# Patient Record
Sex: Male | Born: 1995
Health system: Southern US, Community
[De-identification: ages and names within clinical notes are randomized; demographics above are authoritative.]

---
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
## 2019-02-12 ENCOUNTER — Other Ambulatory Visit: Payer: Self-pay

## 2019-02-12 ENCOUNTER — Encounter: Payer: Self-pay | Admitting: Family Medicine

## 2019-02-12 ENCOUNTER — Ambulatory Visit: Payer: BLUE CROSS/BLUE SHIELD | Admitting: Family Medicine

## 2019-02-12 DIAGNOSIS — M24559 Contracture, unspecified hip: Secondary | ICD-10-CM | POA: Diagnosis not present

## 2019-02-12 DIAGNOSIS — M999 Biomechanical lesion, unspecified: Secondary | ICD-10-CM | POA: Diagnosis not present

## 2019-02-12 NOTE — Patient Instructions (Signed)
Good to see you  Ice is your friend Exercises 3 times a week.  You should do well  Se eme again in 6 weeks [employer] for fathers day ;)

## 2019-02-12 NOTE — Progress Notes (Signed)
Tawana ScaleZach  D[facility] [address] [place], [place] [address] Phone: [phone] Subjective:   Bruce Donath, Valerie Wolf, am serving as a scribe for Dr. Antoine PrimasZachary .   CC: Pain in low back  OZH:[id number]:Subjective  Nathan Duffelustin M Skowron is a 23 y.o. male coming in with complaint of pain throughout his spine. Feels that his back gets tight easily. Has not been cheering but has been running a lot recently. Initially started out having a lot of pain but is not having as much pain. Can sometimes have tingling down his glute and into the left leg.        History reviewed. No pertinent past medical history. History reviewed. No pertinent surgical history. Social History   Socioeconomic History  . Marital status: Single    Spouse name: Not on file  . Number of children: Not on file  . Years of education: Not on file  . Highest education level: Not on file  Occupational History  . Not on file  Social Needs  . Financial resource strain: Not on file  . Food insecurity:    Worry: Not on file    Inability: Not on file  . Transportation needs:    Medical: Not on file    Non-medical: Not on file  Tobacco Use  . Smoking status: Not on file  Substance and Sexual Activity  . Alcohol use: Not on file  . Drug use: Not on file  . Sexual activity: Not on file  Lifestyle  . Physical activity:    Days per week: Not on file    Minutes per session: Not on file  . Stress: Not on file  Relationships  . Social connections:    Talks on phone: Not on file    Gets together: Not on file    Attends religious service: Not on file    Active member of club or organization: Not on file    Attends meetings of clubs or organizations: Not on file    Relationship status: Not on file  Other Topics Concern  . Not on file  Social History Narrative  . Not on file   Not on File History reviewed. No pertinent family history.  No family history of autoimmune No current outpatient medications on file.    Past medical history, social, surgical and family history all reviewed in electronic medical record.  No pertanent information unless stated regarding to the chief complaint.   Review of Systems:  No headache, visual changes, nausea, vomiting, diarrhea, constipation, dizziness, abdominal pain, skin rash, fevers, chills, night sweats, weight loss, swollen lymph nodes, body aches, joint swelling, , chest pain, shortness of breath, mood changes.  Positive muscle aches  Objective  Blood pressure 118/78, pulse (!) 59, height 1' (0.305 m), weight 230 lb (104.3 kg), SpO2 99 %. Systems examined below as of    General: No apparent distress alert and oriented x3 mood and affect normal, dressed appropriately.  HEENT: Pupils equal, extraocular movements intact  Respiratory: Patient's speak in full sentences and does not appear short of breath  Cardiovascular: No lower extremity edema, non tender, no erythema  Skin: Warm dry intact with no signs of infection or rash on extremities or on axial skeleton.  Abdomen: Soft nontender  Neuro: Cranial nerves II through XII are intact, neurovascularly intact in all extremities with 2+ DTRs and 2+ pulses.  Lymph: No lymphadenopathy of posterior or anterior cervical chain or axillae bilaterally.  Gait normal with good balance and  coordination.  MSK:  Non tender with full range of motion and good stability and symmetric strength and tone of shoulders, elbows, wrist, hip, knee and ankles bilaterally.  Back Exam:  Inspection: Unremarkable  Motion: Flexion 35 deg, Extension 25 deg, Side Bending to 35 deg bilaterally,  Rotation to 45 deg bilaterally  SLR laying: Negative  XSLR laying: Negative  Palpable tenderness: Tender to palpation the paraspinal musculature lumbar spine right greater than left. FABER: Tightness bilaterally right greater than left. Sensory change: Gross sensation intact to all lumbar and sacral dermatomes.  Reflexes: 2+ at both patellar tendons,  2+ at achilles tendons, Babinski's downgoing.  Strength at foot  Plantar-flexion: 5/5 Dorsi-flexion: 5/5 Eversion: 5/5 Inversion: 5/5  Leg strength  Quad: 5/5 Hamstring: 5/5 Hip flexor: 5/5 Hip abductors: 5/5    Osteopathic findings  C2 flexed rotated and side bent right C4 flexed rotated and side bent left C6 flexed rotated and side bent left T3 extended rotated and side bent right inhaled third rib T9 extended rotated and side bent left L2 flexed rotated and side bent right Sacrum right on right    Impression and Recommendations:     This case required medical decision making of moderate complexity. The above documentation has been reviewed and is accurate and complete Judi Saa, DO       Note: This dictation was prepared with Dragon dictation along with smaller phrase technology. Any transcriptional errors that result from this process are unintentional.

## 2019-02-12 NOTE — Assessment & Plan Note (Signed)
Decision today to treat with OMT was based on Physical Exam  After verbal consent patient was treated with HVLA, ME, FPR techniques in  thoracic, lumbar and sacral areas  Patient tolerated the procedure well with improvement in symptoms  Patient given exercises, stretches and lifestyle modifications  See medications in patient instructions if given  Patient will follow up in 4-6 weeks 

## 2019-02-12 NOTE — Assessment & Plan Note (Signed)
Hip flexor..  Bilateral.  Discussed icing regimen exercises.  Discussed core strengthening and hip stability.  Home exercises given.  Responded well to manipulation.  Follow-up again in 4 to 6 weeks

## 2019-03-26 ENCOUNTER — Ambulatory Visit: Payer: BLUE CROSS/BLUE SHIELD | Admitting: Family Medicine

## 2019-03-26 ENCOUNTER — Encounter: Payer: Self-pay | Admitting: Family Medicine

## 2019-03-26 ENCOUNTER — Other Ambulatory Visit: Payer: Self-pay

## 2019-03-26 VITALS — BP 110/80 | Wt 239.0 lb

## 2019-03-26 DIAGNOSIS — M999 Biomechanical lesion, unspecified: Secondary | ICD-10-CM | POA: Diagnosis not present

## 2019-03-26 DIAGNOSIS — M24559 Contracture, unspecified hip: Secondary | ICD-10-CM

## 2019-03-26 NOTE — Assessment & Plan Note (Signed)
Decision today to treat with OMT was based on Physical Exam  After verbal consent patient was treated with HVLA, ME, FPR techniques in  thoracic, lumbar and sacral areas  Patient tolerated the procedure well with improvement in symptoms  Patient given exercises, stretches and lifestyle modifications  See medications in patient instructions if given  Patient will follow up in 4-8 weeks 

## 2019-03-26 NOTE — Assessment & Plan Note (Signed)
Tightness still noted.  We discussed proper lifting mechanics especially with squatting.  Discussed home exercises, discussed which activities to do which wants to avoid.  Patient increase activity slowly.  Follow-up again in 4 to 8 weeks

## 2019-03-26 NOTE — Progress Notes (Signed)
Nathan ScaleZach Manav Bradley D.O[facility] [address] [place], [place] [address] Phone: [phone] Subjective:   Nathan Bradley, Nathan Bradley, am serving as a scribe for Dr. Antoine PrimasZachary Jordynn Bradley.   CC:  Back pain follow up   [name]  Nathan Bradley is a 23 y.o. male coming in with complaint of back pain. Just started to run and feels that his back is tight. Has been doing well otherwise.       No past medical history on file. No past surgical history on file. Social History   Socioeconomic History  . Marital status: Single    Spouse name: Not on file  . Number of children: Not on file  . Years of education: Not on file  . Highest education level: Not on file  Occupational History  . Not on file  Social Needs  . Financial resource strain: Not on file  . Food insecurity    Worry: Not on file    Inability: Not on file  . Transportation needs    Medical: Not on file    Non-medical: Not on file  Tobacco Use  . Smoking status: Not on file  Substance and Sexual Activity  . Alcohol use: Not on file  . Drug use: Not on file  . Sexual activity: Not on file  Lifestyle  . Physical activity    Days per week: Not on file    Minutes per session: Not on file  . Stress: Not on file  Relationships  . Social Musicianconnections    Talks on phone: Not on file    Gets together: Not on file    Attends religious service: Not on file    Active member of club or organization: Not on file    Attends meetings of clubs or organizations: Not on file    Relationship status: Not on file  Other Topics Concern  . Not on file  Social History Narrative  . Not on file   Not on File No family history on file.  No family history of autoimmune No current outpatient medications on file.    Past medical history, social, surgical and family history all reviewed in electronic medical record.  No pertanent information unless stated regarding to the chief complaint.   Review of Systems:  No headache, visual  changes, nausea, vomiting, diarrhea, constipation, dizziness, abdominal pain, skin rash, fevers, chills, night sweats, weight loss, swollen lymph nodes, body aches, joint swelling,  chest pain, shortness of breath, mood changes.  Mild positive muscle aches  Objective  Blood pressure 110/80, weight 239 lb (108.4 kg).   General: No apparent distress alert and oriented x3 mood and affect normal, dressed appropriately.  HEENT: Pupils equal, extraocular movements intact  Respiratory: Patient's speak in full sentences and does not appear short of breath  Cardiovascular: No lower extremity edema, non tender, no erythema  Skin: Warm dry intact with no signs of infection or rash on extremities or on axial skeleton.  Abdomen: Soft nontender  Neuro: Cranial nerves II through XII are intact, neurovascularly intact in all extremities with 2+ DTRs and 2+ pulses.  Lymph: No lymphadenopathy of posterior or anterior cervical chain or axillae bilaterally.  Gait normal with good balance and coordination.  MSK:  Non tender with full range of motion and good stability and symmetric strength and tone of shoulders, elbows, wrist, hip, knee and ankles bilaterally.  Back Exam:  Inspection: Loss of lordosis Motion: Flexion 40 deg, Extension 25 deg, Side Bending to  35 deg bilaterally, Rotation to 30 deg bilaterally  SLR laying: Negative  XSLR laying: Negative  Palpable tenderness: Tender to palpation in the paraspinal musculature.Marland Kitchen FABER: positive faber. Sensory change: Gross sensation intact to all lumbar and sacral dermatomes.  Reflexes: 2+ at both patellar tendons, 2+ at achilles tendons, Babinski's downgoing.  Strength at foot  Plantar-flexion: 5/5 Dorsi-flexion: 5/5 Eversion: 5/5 Inversion: 5/5  Leg strength  Quad: 5/5 Hamstring: 5/5 Hip flexor: 5/5 Hip abductors: 5/5  Gait unremarkable.   Osteopathic findings C2 flexed rotated and side bent right C7 flexed rotated and side bent left T3 extended rotated  and side bent right inhaled third rib T7 extended rotated and side bent left L2 flexed rotated and side bent right Sacrum right on right    Impression and Recommendations:     This case required medical decision making of moderate complexity. The above documentation has been reviewed and is accurate and complete Nathan Pulley, DO       Note: This dictation was prepared with Dragon dictation along with smaller phrase technology. Any transcriptional errors that result from this process are unintentional.

## 2019-04-21 DIAGNOSIS — M79675 Pain in left toe(s): Secondary | ICD-10-CM | POA: Diagnosis not present

## 2019-04-21 DIAGNOSIS — L03032 Cellulitis of left toe: Secondary | ICD-10-CM | POA: Diagnosis not present

## 2019-04-21 DIAGNOSIS — L600 Ingrowing nail: Secondary | ICD-10-CM | POA: Diagnosis not present

## 2019-05-01 ENCOUNTER — Ambulatory Visit: Payer: BC Managed Care – PPO | Admitting: Family Medicine

## 2019-05-05 DIAGNOSIS — L600 Ingrowing nail: Secondary | ICD-10-CM | POA: Diagnosis not present

## 2019-05-05 DIAGNOSIS — L03032 Cellulitis of left toe: Secondary | ICD-10-CM | POA: Diagnosis not present

## 2020-10-21 DIAGNOSIS — L03031 Cellulitis of right toe: Secondary | ICD-10-CM | POA: Diagnosis not present

## 2020-10-21 DIAGNOSIS — L600 Ingrowing nail: Secondary | ICD-10-CM | POA: Diagnosis not present

## 2021-04-15 DIAGNOSIS — N62 Hypertrophy of breast: Secondary | ICD-10-CM | POA: Diagnosis not present

## 2021-04-15 DIAGNOSIS — L03031 Cellulitis of right toe: Secondary | ICD-10-CM | POA: Diagnosis not present

## 2021-04-15 DIAGNOSIS — L600 Ingrowing nail: Secondary | ICD-10-CM | POA: Diagnosis not present

## 2021-04-29 DIAGNOSIS — L03031 Cellulitis of right toe: Secondary | ICD-10-CM | POA: Diagnosis not present

## 2021-04-29 DIAGNOSIS — L600 Ingrowing nail: Secondary | ICD-10-CM | POA: Diagnosis not present

## 2021-07-18 DIAGNOSIS — L237 Allergic contact dermatitis due to plants, except food: Secondary | ICD-10-CM | POA: Diagnosis not present
